# Patient Record
Sex: Female | Born: 2000 | Race: Black or African American | Hispanic: No | Marital: Single | Smoking: Never smoker
Health system: Southern US, Community
[De-identification: ages and names within clinical notes are randomized; demographics above are authoritative.]

---
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
## 2018-04-22 ENCOUNTER — Encounter (HOSPITAL_COMMUNITY): Payer: Self-pay | Admitting: Emergency Medicine

## 2018-04-22 ENCOUNTER — Emergency Department (HOSPITAL_COMMUNITY)
Admission: EM | Admit: 2018-04-22 | Discharge: 2018-04-22 | Disposition: A | Discharge status: Home / Self Care | Payer: Self-pay | Attending: Emergency Medicine | Admitting: Emergency Medicine

## 2018-04-22 DIAGNOSIS — J029 Acute pharyngitis, unspecified: Secondary | ICD-10-CM | POA: Insufficient documentation

## 2018-04-22 LAB — CBC WITH DIFFERENTIAL/PLATELET
ABS IMMATURE GRANULOCYTES: 0.03 10*3/uL (ref 0.00–0.07)
BASOS PCT: 0 %
Basophils Absolute: 0 10*3/uL (ref 0.0–0.1)
EOS ABS: 0.1 10*3/uL (ref 0.0–0.5)
Eosinophils Relative: 1 %
HEMATOCRIT: 38.1 % (ref 36.0–46.0)
Hemoglobin: 11.9 g/dL — ABNORMAL LOW (ref 12.0–15.0)
IMMATURE GRANULOCYTES: 0 %
LYMPHS ABS: 2.5 10*3/uL (ref 0.7–4.0)
Lymphocytes Relative: 28 %
MCH: 26.9 pg (ref 26.0–34.0)
MCHC: 31.2 g/dL (ref 30.0–36.0)
MCV: 86 fL (ref 80.0–100.0)
MONO ABS: 0.7 10*3/uL (ref 0.1–1.0)
Monocytes Relative: 8 %
NEUTROS PCT: 63 %
Neutro Abs: 5.7 10*3/uL (ref 1.7–7.7)
Platelets: 300 10*3/uL (ref 150–400)
RBC: 4.43 MIL/uL (ref 3.87–5.11)
RDW: 11.9 % (ref 11.5–15.5)
WBC: 9.1 10*3/uL (ref 4.0–10.5)
nRBC: 0 % (ref 0.0–0.2)

## 2018-04-22 LAB — GROUP A STREP BY PCR: GROUP A STREP BY PCR: NOT DETECTED

## 2018-04-22 LAB — MONONUCLEOSIS SCREEN: MONO SCREEN: NEGATIVE

## 2018-04-22 MED ORDER — LIDOCAINE VISCOUS HCL 2 % MT SOLN
15.0000 mL | Freq: Once | OROMUCOSAL | Status: AC
  Administered 2018-04-22: 15 mL via OROMUCOSAL
  Filled 2018-04-22: qty 15

## 2018-04-22 MED ORDER — LIDOCAINE VISCOUS HCL 2 % MT SOLN: 15.0000 mL | OROMUCOSAL | 0 refills | Status: AC | PRN

## 2018-04-22 MED ORDER — CETIRIZINE-PSEUDOEPHEDRINE ER 5-120 MG PO TB12: 1.0000 | ORAL_TABLET | Freq: Every day | ORAL | 0 refills | Status: AC

## 2018-04-22 NOTE — ED Triage Notes (Signed)
Pt c/o sore throat x 4 weeks.  Reports seen PCP 2 times and told has tonsillitis and nothing more can be done.   Pt reports last night when coughed felt like something came up but when swallowed feels like something stuck in her throat.

## 2018-04-22 NOTE — ED Provider Notes (Signed)
[facility]EM[facility]ENCY [facility]PT Provider Note   CSN: [id number] Arrival date & time: 04/22/18  1259   History   Chief Complaint Chief Complaint  Patient presents with  . Sore Throat    HPI Sarah Singleton is a 18 y.o. female presenting with sore throat onset 4 weeks ago. Patient reports she was evaluated by PCP twice and diagnosed with tonsillitis. Patient reports she had a fever last week, but fever resolved. Patient reports associated intermittent dry cough, congestion, and rhinorrhea. Patient reports fatigue and dysphagia. Patient states she has tried theraflu, tylenol, and ibuprofen with some relief. Patient reports yesterday she coughed mucus, but she still has the sensation that mucus is present. Patient denies shortness of breath, chest pain, abdominal pain, nausea, vomiting, or diarrhea. Patient reports sick contacts with influenza.    HPI  History reviewed. No pertinent past medical history.  There are no active problems to display for this patient.   History reviewed. No pertinent surgical history.   OB History   No obstetric history on file.      Home Medications    Prior to Admission medications   Medication Sig Start Date End Date Taking? Authorizing Provider  cetirizine-pseudoephedrine (ZYRTEC-D) 5-120 MG tablet Take 1 tablet by mouth daily. 04/22/18   Carlyle BasquesHernandez, Saber Dickerman P, PA-C  lidocaine (XYLOCAINE) 2 % solution Use as directed 15 mLs in the mouth or throat as needed for mouth pain. Gargle/swish and spit out the medication. 04/22/18   Leretha DykesHernandez, Kaidence Callaway P, PA-C    Family History No family history on file.  Social History Social History   Tobacco Use  . Smoking status: Never Smoker  . Smokeless tobacco: Never Used  Substance Use Topics  . Alcohol use: No    Frequency: Never  . Drug use: No     Allergies   Patient has no known allergies.   Review of Systems Review of Systems  Constitutional: Positive for appetite change, fatigue and fever.  Negative for activity change.  HENT: Positive for congestion, rhinorrhea, sore throat and trouble swallowing. Negative for ear pain, postnasal drip and voice change.   Eyes: Negative for pain, redness and itching.  Respiratory: Positive for cough. Negative for shortness of breath.   Cardiovascular: Negative for chest pain.  Gastrointestinal: Negative for abdominal pain, diarrhea, nausea and vomiting.  Musculoskeletal: Negative for myalgias.  Skin: Negative for rash.  Allergic/Immunologic: Negative for environmental allergies and immunocompromised state.  Neurological: Negative for dizziness, weakness and headaches.     Physical Exam Updated Vital Signs BP 105/75   Pulse 64   Temp 98.5 F (36.9 C) (Oral)   Resp 16   LMP 04/17/2018   SpO2 100%   Physical Exam Vitals signs and nursing note reviewed.  Constitutional:      General: She is not in acute distress.    Appearance: She is well-developed. She is not diaphoretic.  HENT:     Head: Normocephalic and atraumatic.     Right Ear: Tympanic membrane, ear canal and external ear normal. No tenderness. No middle ear effusion.     Left Ear: Tympanic membrane, ear canal and external ear normal. No tenderness.  No middle ear effusion.     Nose: Mucosal edema, congestion and rhinorrhea present.     Mouth/Throat:     Mouth: Mucous membranes are moist.     Pharynx: Uvula midline. Posterior oropharyngeal erythema present. No oropharyngeal exudate.     Tonsils: No tonsillar exudate or tonsillar abscesses. Swelling: 1+ on the  right. 1+ on the left.  Eyes:     General:        Right eye: No discharge.        Left eye: No discharge.     Conjunctiva/sclera: Conjunctivae normal.  Neck:     Musculoskeletal: Normal range of motion and neck supple.  Cardiovascular:     Rate and Rhythm: Normal rate and regular rhythm.     Heart sounds: Normal heart sounds. No murmur. No friction rub. No gallop.   Pulmonary:     Effort: Pulmonary effort is  normal. No respiratory distress.     Breath sounds: Normal breath sounds. No wheezing or rales.  Abdominal:     Palpations: Abdomen is soft.     Tenderness: There is no abdominal tenderness.  Musculoskeletal: Normal range of motion.  Lymphadenopathy:     Cervical: No cervical adenopathy.  Skin:    Findings: No rash.  Neurological:     Mental Status: She is alert and oriented to person, place, and time.     ED Treatments / Results  Labs (all labs ordered are listed, but only abnormal results are displayed) Labs Reviewed  CBC WITH DIFFERENTIAL/PLATELET - Abnormal; Notable for the following components:      Result Value   Hemoglobin 11.9 (*)    All other components within normal limits  GROUP A STREP BY PCR  MONONUCLEOSIS SCREEN    EKG None  Radiology No results found.  Procedures Procedures (including critical care time)  Medications Ordered in ED Medications  lidocaine (XYLOCAINE) 2 % viscous mouth solution 15 mL (15 mLs Mouth/Throat Given 04/22/18 1718)     Initial Impression / Assessment and Plan / ED Course  I have reviewed the triage vital signs and the nursing notes.  Pertinent labs & imaging results that were available during my care of the patient were reviewed by me and considered in my medical decision making (see chart for details).    Patients symptoms are consistent with URI, likely viral etiology. Strep test is negative. WBCs are within normal limits. Monospot test is negative. Discussed that antibiotics are not indicated at this time. Suspect symptoms have have a viral vs allergic etiology. Will prescribe zyrtec and lidocaine for symptoms. Pt will be discharged with symptomatic treatment.  Discussed importance of following up with PCP. Verbalizes understanding and is agreeable with plan. Pt is hemodynamically stable & in NAD prior to dc.   Final Clinical Impressions(s) / ED Diagnoses   Final diagnoses:  Sore throat    ED Discharge Orders          Ordered    cetirizine-pseudoephedrine (ZYRTEC-D) 5-120 MG tablet  Daily     04/22/18 1716    lidocaine (XYLOCAINE) 2 % solution  As needed     04/22/18 1716           Leretha Dykes, [place] 04/22/18 1744    Mancel Bale, MD 04/23/18 0004

## 2018-04-22 NOTE — Discharge Instructions (Addendum)
You have been seen today for sore throat. Please read and follow all provided instructions.   1. Medications: zyrtec, viscous lidocaine for sore throat, usual home medications 2. Treatment: rest, drink plenty of fluids 3. Follow Up: Please follow up with your primary doctor in 2 days for discussion of your diagnoses and further evaluation after today's visit; if you do not have a primary care doctor use the resource guide provided to find one; Please return to the ER for any new or worsening symptoms. Please obtain all of your results from medical records or have your doctors office obtain the results - share them with your doctor - you should be seen at your doctors office. Call today to arrange your follow up.   Take medications as prescribed. Please review all of the medicines and only take them if you do not have an allergy to them. Return to the emergency room for worsening condition or new concerning symptoms. Follow up with your regular doctor. If you don't have a regular doctor use one of the numbers below to establish a primary care doctor.  Please be aware that if you are taking birth control pills, taking other prescriptions, ESPECIALLY ANTIBIOTICS may make the birth control ineffective - if this is the case, either do not engage in sexual activity or use alternative methods of birth control such as condoms until you have finished the medicine and your family doctor says it is OK to restart them. If you are on a blood thinner such as COUMADIN, be aware that any other medicine that you take may cause the coumadin to either work too much, or not enough - you should have your coumadin level rechecked in next 7 days if this is the case.  ?  It is also a possibility that you have an allergic reaction to any of the medicines that you have been prescribed - Everybody reacts differently to medications and while MOST people have no trouble with most medicines, you may have a reaction such as nausea,  vomiting, rash, swelling, shortness of breath. If this is the case, please stop taking the medicine immediately and contact your physician.  ?  You should return to the ER if you develop severe or worsening symptoms.   Emergency Department Resource Guide 1) Find a Doctor and Pay Out of Pocket Although you won't have to find out who is covered by your insurance plan, it is a good idea to ask around and get recommendations. You will then need to call the office and see if the doctor you have chosen will accept you as a new patient and what types of options they offer for patients who are self-pay. Some doctors offer discounts or will set up payment plans for their patients who do not have insurance, but you will need to ask so you aren't surprised when you get to your appointment.  2) Contact Your Local Health Department Not all health departments have doctors that can see patients for sick visits, but many do, so it is worth a call to see if yours does. If you don't know where your local health department is, you can check in your phone book. The CDC also has a tool to help you locate your state's health department, and many state websites also have listings of all of their local health departments.  3) Find a Walk-in Clinic If your illness is not likely to be very severe or complicated, you may want to try a walk in clinic.  These are popping up all over the country in pharmacies, drugstores, and shopping centers. They're usually staffed by nurse practitioners or physician assistants that have been trained to treat common illnesses and complaints. They're usually fairly quick and inexpensive. However, if you have serious medical issues or chronic medical problems, these are probably not your best option.  No Primary Care Doctor: Call Health Connect at  [phone] - they can help you locate a primary care doctor that  accepts your insurance, provides certain services, etc. Physician Referral [phone]  Emergency Department Resource Guide 1) Find a Doctor and Pay Out of Pocket Although you won't have to find out who is covered by your insurance plan, it is a good idea to ask around and get recommendations. You will then need to call the office and see if the doctor you have chosen will accept you as a new patient and what types of options they offer for patients who are self-pay. Some doctors offer discounts or will set up payment plans for their patients who do not have insurance, but you will need to ask so you aren't surprised when you get to your appointment.  2) Contact Your Local Health Department Not all health departments have doctors that can see patients for sick visits, but many do, so it is worth a call to see if yours does. If you don't know where your local health department is, you can check in your phone book. The CDC also has a tool to help you locate your state's health department, and many state websites also have listings of all of their local health departments.  3) Find a [facility] If your illness is not likely to be very severe or complicated, you may want to try a walk in clinic. These are popping up all over the country in pharmacies, drugstores, and shopping centers. They're usually staffed by nurse practitioners or physician assistants that have been trained to treat common illnesses and complaints. They're usually fairly quick and inexpensive. However, if you have serious medical issues or chronic medical problems, these are probably not your best option.  No Primary Care Doctor: Call Health Connect at  [phone] - they can help you locate a primary care doctor that  accepts your insurance, provides certain services, etc. Physician Referral Service- [phone]  Chronic Pain Problems: Organization         Address  Phone   Notes  [facility]  [phone] Patients need to be referred by their primary care doctor.    Medication Assistance: Organization         Address  Phone   Notes  [facility] Medication [facility] [address] [place], [place] [address] [phone] --Must be a resident of [facility] -- Must have NO insurance coverage whatsoever (no Medicaid/ Medicare, etc.) -- The pt. MUST have a primary care doctor that directs their care regularly and follows them in the community   [facility]  [phone]   [employer]  [phone]    Agencies that provide inexpensive medical care: Organization         Address  Phone   Notes  [place]  [phone]   [facility]    [phone]   [facility] [place], [place] [address] [phone]   [place] [address], [place] [phone]   [facility]    (  [phone]   [facility]    [phone]   [facility]  [address], [place] Phone:  [phone], Fax:  [phone] Hours of Operation:  9 am - 6 pm, M-F.  Also accepts Medicaid/Medicare and self-pay.  [facility] for [place] [address], [address], [place] Phone: [phone], Fax: [phone]. Hours of Operation:  8:30 am - 5:30 pm, M-F.  Also accepts Medicaid and self-pay.  [facility] [place] [address], [place] Phone: [phone]   [place], [place], [place] [phone], Ext. [phone] Mondays & Thursdays: 7-9 AM.  First 15 patients are seen on a first come, first serve basis.    [place] Providers:  Organization         Address  Phone   Notes  [facility] [address], [place] [phone] Also accepts self-pay patients.  [facility] [address], [place]  [phone]   [place], [address], [place] [phone]   [facility] [address] [place] [phone]   Lucianne Lei [address], [place]   [phone] Only accepts [place] [facility] [place] patients after they have their name applied to their card.   Self-Pay (no insurance) in [facility]:  Organization         Address  Phone   Notes  Sickle Cell Patients, [facility] [place] [phone]   [facility] [place] [phone]   [facility] [place]  [place], [place], [place] [phone]   [facility]/Dr. Osei-Bonsu  [address], [place] or [address], [place] [phone] Phone number for both [place] and [place] locations is the same.  [facility] [address], [place] [phone]   [facility] [address] [place] or [address] [phone]   [facility] [address] [place] [phone], phone; [phone], fax Sees patients 1st and 3rd Saturday of every month.  Must not qualify for public or private insurance (i.e. Medicaid, Medicare, [place] [employer] Choice, Veterans' Benefits)  Household income should be no more than 200% of the poverty level The clinic cannot treat you if you are pregnant or think you are pregnant  Sexually transmitted diseases are not treated at the clinic.

## 2018-05-14 ENCOUNTER — Encounter (HOSPITAL_COMMUNITY): Payer: Self-pay

## 2018-05-14 ENCOUNTER — Emergency Department (HOSPITAL_COMMUNITY)
Admission: EM | Admit: 2018-05-14 | Discharge: 2018-05-14 | Disposition: A | Discharge status: Home / Self Care | Payer: Self-pay | Attending: Emergency Medicine | Admitting: Emergency Medicine

## 2018-05-14 DIAGNOSIS — R05 Cough: Secondary | ICD-10-CM | POA: Insufficient documentation

## 2018-05-14 DIAGNOSIS — Z5321 Procedure and treatment not carried out due to patient leaving prior to being seen by health care provider: Secondary | ICD-10-CM | POA: Insufficient documentation

## 2018-05-14 NOTE — ED Triage Notes (Signed)
Pt c/o persistent cough x 1 week with congestion.

## 2018-05-14 NOTE — ED Notes (Signed)
Pt stated she needed to go home, she would come back in the morning

## 2018-05-15 ENCOUNTER — Emergency Department (HOSPITAL_COMMUNITY): Payer: Self-pay

## 2018-05-15 ENCOUNTER — Encounter (HOSPITAL_COMMUNITY): Payer: Self-pay | Admitting: Emergency Medicine

## 2018-05-15 ENCOUNTER — Emergency Department (HOSPITAL_COMMUNITY)
Admission: EM | Admit: 2018-05-15 | Discharge: 2018-05-15 | Disposition: A | Discharge status: Home / Self Care | Payer: Self-pay | Attending: Emergency Medicine | Admitting: Emergency Medicine

## 2018-05-15 DIAGNOSIS — R0981 Nasal congestion: Secondary | ICD-10-CM | POA: Insufficient documentation

## 2018-05-15 DIAGNOSIS — J029 Acute pharyngitis, unspecified: Secondary | ICD-10-CM | POA: Insufficient documentation

## 2018-05-15 DIAGNOSIS — Z79899 Other long term (current) drug therapy: Secondary | ICD-10-CM | POA: Insufficient documentation

## 2018-05-15 DIAGNOSIS — R05 Cough: Secondary | ICD-10-CM | POA: Insufficient documentation

## 2018-05-15 DIAGNOSIS — R0989 Other specified symptoms and signs involving the circulatory and respiratory systems: Secondary | ICD-10-CM | POA: Insufficient documentation

## 2018-05-15 DIAGNOSIS — J3489 Other specified disorders of nose and nasal sinuses: Secondary | ICD-10-CM | POA: Insufficient documentation

## 2018-05-15 MED ORDER — DEXAMETHASONE SODIUM PHOSPHATE 4 MG/ML IJ SOLN
4.0000 mg | Freq: Once | INTRAMUSCULAR | Status: AC
  Administered 2018-05-15: 4 mg via INTRAMUSCULAR
  Filled 2018-05-15: qty 1

## 2018-05-15 NOTE — ED Triage Notes (Signed)
1 week of sore throat and cough. Been taking dayquil. She reports she has been having headaches and pain above her eye which brought her in today.

## 2018-05-15 NOTE — Discharge Instructions (Addendum)
Please purchase an expectorant like Mucinex to help loosen and thin the mucus production.You may also try some over the counter Robitussin to help with your cough. Continue to hydrate with plenty of fluids and Gatorade.If you experience any shortness of breath, chest pain or fever please return to the ED for reevaluation.  ° °

## 2018-05-15 NOTE — ED Provider Notes (Signed)
[facility] EMERGENCY DEPARTMENT Provider Note   CSN: [id number] Arrival date & time: 05/15/18  5366     History   Chief Complaint Chief Complaint  Patient presents with  . Sore Throat    HPI Sarah Singleton is a 18 y.o. female.  18 y.o female with no PMH presents to the ED with a chief complaint of sore throat x 1 week. Patient describes a burning sensation with swallowing food and her secretions, she also reports a productive cough.  She states feeling some sinus pressure along with congestion on her chest.  She denies any exacerbating or alleviating factors.  Patient has tried DayQuil daily for the past week but reports no improvement in symptoms, reports not trying any decongestions.  She denies any shortness of breath, chest pain, headache, previous history of asthma or COPD.      OB History   No obstetric history on file.      Home Medications    Prior to Admission medications   Medication Sig Start Date End Date Taking? Authorizing Provider  cetirizine-pseudoephedrine (ZYRTEC-D) 5-120 MG tablet Take 1 tablet by mouth daily. 04/22/18   Carlyle Basques P, PA-C  lidocaine (XYLOCAINE) 2 % solution Use as directed 15 mLs in the mouth or throat as needed for mouth pain. Gargle/swish and spit out the medication. 04/22/18   Leretha Dykes, PA-C    Family History History reviewed. No pertinent family history.  Social History Social History   Tobacco Use  . Smoking status: Never Smoker  . Smokeless tobacco: Never Used  Substance Use Topics  . Alcohol use: No    Frequency: Never  . Drug use: No     Allergies   Patient has no known allergies.   Review of Systems Review of Systems  Constitutional: Negative for fever.  HENT: Positive for sore throat.   Respiratory: Positive for cough. Negative for shortness of breath.   Cardiovascular: Negative for chest pain.     Physical Exam Updated Vital Signs BP 106/71 (BP Location: Right Arm)   Pulse  84   Temp 98.5 F (36.9 C) (Oral)   Resp 17   LMP 05/14/2018   SpO2 100%   Physical Exam Vitals signs and nursing note reviewed.  Constitutional:      General: She is not in acute distress.    Appearance: She is well-developed.  HENT:     Head: Normocephalic and atraumatic.     Nose: Congestion and rhinorrhea present.     Mouth/Throat:     Mouth: Mucous membranes are moist.     Pharynx: Oropharynx is clear. Uvula midline. Posterior oropharyngeal erythema present. No pharyngeal swelling or oropharyngeal exudate.     Tonsils: No tonsillar exudate. Swelling: 2+ on the right. 2+ on the left.  Eyes:     Pupils: Pupils are equal, round, and reactive to light.  Neck:     Musculoskeletal: Normal range of motion.  Cardiovascular:     Rate and Rhythm: Normal rate and regular rhythm.     Heart sounds: Normal heart sounds.  Pulmonary:     Effort: Pulmonary effort is normal. No respiratory distress.     Breath sounds: Normal breath sounds.  Abdominal:     General: Bowel sounds are normal. There is no distension.     Palpations: Abdomen is soft.     Tenderness: There is no abdominal tenderness.  Musculoskeletal:        General: No tenderness or deformity.  Right lower leg: No edema.     Left lower leg: No edema.  Skin:    General: Skin is warm and dry.  Neurological:     Mental Status: She is alert and oriented to person, place, and time.      ED Treatments / Results  Labs (all labs ordered are listed, but only abnormal results are displayed) Labs Reviewed - No data to display  EKG None  Radiology Dg Chest 2 View  Result Date: 05/15/2018 CLINICAL DATA:  Pain under both breasts EXAM: CHEST - 2 VIEW COMPARISON:  None. FINDINGS: The heart size and mediastinal contours are within normal limits. Both lungs are clear. The visualized skeletal structures are unremarkable. IMPRESSION: No active cardiopulmonary disease. Electronically Signed   By: Elige Ko   On: 05/15/2018  10:10    Procedures Procedures (including critical care time)  Medications Ordered in ED Medications  dexamethasone (DECADRON) injection 4 mg (4 mg Intramuscular Given 05/15/18 1004)     Initial Impression / Assessment and Plan / ED Course  I have reviewed the triage vital signs and the nursing notes.  Pertinent labs & imaging results that were available during my care of the patient were reviewed by me and considered in my medical decision making (see chart for details).    Patent presents with 1 week of cough and sore throat, no previous PMH.  During examination patient is got clear lung sounds to auscultation, mild erythema on the oropharynx but no swelling or tonsillar abscesses.  Due to patient's symptoms been active for 1 week will obtain chest x-ray to rule out any pneumonia she reports somewhat of a productive cough this past week.  Patient is well-appearing, nontoxic will treat with over-the-counter therapy along with have her follow-up with PCP as needed.She has been provided with decadron 4 mg for relieve in her symptoms. DG Chest 2 view showed no consolidation, no acute process.   She will be tried with the medic treatment, suspicious for viral etiology.  She is advised to follow-up with PCP.  Patient understands and agrees with management.  Vitals stable for discharge afebrile.  Final Clinical Impressions(s) / ED Diagnoses   Final diagnoses:  Sore throat    ED Discharge Orders    None       Claude Manges, PA-C 05/15/18 1019    Jacalyn Lefevre, MD 05/15/18 1115

## 2018-05-15 NOTE — ED Notes (Signed)
Pt verbalized understanding of discharge instructions and denies any further questions at this time.   

## 2018-08-20 DIAGNOSIS — J312 Chronic pharyngitis: Secondary | ICD-10-CM

## 2018-08-20 NOTE — ED Triage Notes (Signed)
Patient arrives ambulatory to ED with c/c of head pain and neck pain, onset a few weeks. Patient is requesting an MRI saying everyone is taking this lightly. Patient was seen at [facility] these last 2 days and had blood drawn and was tested for strep throat which was all negative.

## 2018-08-20 NOTE — ED Provider Notes (Signed)
EMERGENCY DEPARTMENT HISTORY AND PHYSICAL EXAM    Date: 08/20/2018  Patient Name: Roberta Thomas    History of Presenting Illness     Chief Complaint   Patient presents with   ??? Headache   ??? Neck Pain         History Provided By: Patient    Additional History (Context):   10:48 PM  Roberta Thomas is a 18 y.o. female with no PMHX who presents to the emergency department C/O HA x 1 week.  Pt has associated sxs of neck pain,  nausea, loss of appetite, a sore throat x 2 days and SOB. Pt notes that she feels as if her HA "popped" and the pain traveled down the back of her head to her neck. Pt denies fever, chills, chest pain and any recent sick contact. Pt notes she was tested for Strep Throat yesterday at [facility] and discharged home. She notes that she wanted some of sort of scan to be performed to make sure her neck muscles were okay but was told no at [facility] and instead referred to an ENT.         Social History  No tobacco use. No ETOH use. No illicit drug use.     Family History  No pertinent history     PCP: None        Past History     Past Medical History:  History reviewed. No pertinent past medical history.    Past Surgical History:  History reviewed. No pertinent surgical history.    Family History:  History reviewed. No pertinent family history.    Social History:  Social History     Tobacco Use   ??? Smoking status: Never Smoker   ??? Smokeless tobacco: Never Used   Substance Use Topics   ??? Alcohol use: Not Currently   ??? Drug use: Not Currently       Allergies:  No Known Allergies      Review of Systems   Review of Systems   Constitutional: Positive for appetite change. Negative for chills and fever.   HENT: Positive for sore throat.    Respiratory: Positive for shortness of breath.    Cardiovascular: Negative for chest pain.   Gastrointestinal: Positive for nausea.   Musculoskeletal: Positive for neck pain.   Neurological: Positive for headaches.   All other systems reviewed and are negative.         Physical Exam     Vitals:    08/20/18 2120 08/21/18 0055   BP: 118/69 122/77   Pulse: 75    Resp: 18 16   Temp: 96 ??F (35.6 ??C)    SpO2: 100% 100%   Weight: 64.4 kg (142 lb)    Height:  (1.6 m)      Physical Exam  Vitals signs and nursing note reviewed.   Constitutional:       General: She is not in acute distress.     Appearance: She is well-developed. She is not diaphoretic.   HENT:      Head: Normocephalic and atraumatic.      Right Ear: External ear normal.      Left Ear: External ear normal.      Ears:      Comments: Mostly clear, TM obscured by her cerumen     Mouth/Throat:      Pharynx: No oropharyngeal exudate.      Comments: Tonsillar hypertrophy without exudate and no malodor  Sinus has prominent erythema  Eyes:      General: No scleral icterus.     Conjunctiva/sclera: Conjunctivae normal.      Pupils: Pupils are equal, round, and reactive to light.   Neck:      Musculoskeletal: Normal range of motion and neck supple.      Thyroid: No thyromegaly.      Vascular: No JVD.      Trachea: No tracheal deviation.      Comments: Diffusely tender with bilateral diffuse cervical adenopathy and no crepitus  Cardiovascular:      Rate and Rhythm: Normal rate and regular rhythm.      Heart sounds: Normal heart sounds.   Pulmonary:      Effort: Pulmonary effort is normal. No respiratory distress.      Breath sounds: Normal breath sounds. No stridor.   Abdominal:      General: Bowel sounds are normal. There is no distension.      Palpations: Abdomen is soft.      Tenderness: There is no abdominal tenderness. There is no guarding or rebound.   Musculoskeletal: Normal range of motion.         General: No tenderness.   Lymphadenopathy:      Cervical: No cervical adenopathy.   Skin:     General: Skin is warm and dry.      Findings: No erythema or rash.   Neurological:      Mental Status: She is alert and oriented to person, place, and time.      Cranial Nerves: No cranial nerve deficit.       Coordination: Coordination normal.      Deep Tendon Reflexes: Reflexes are normal and symmetric. Reflexes normal.   Psychiatric:         Behavior: Behavior normal.         Thought Content: Thought content normal.         Judgment: Judgment normal.             Diagnostic Study Results     Labs -   No results found for this or any previous visit (from the past 12 hour(s)).    Radiologic Studies -   CT NECK SOFT TISSUE WO CONT   Final Result   IMPRESSION: No significant abnormality.      CT HEAD WO CONT   Final Result   IMPRESSION:      No acute intracranial abnormalities.        CT Results  (Last 48 hours)               08/20/18 2358  CT NECK SOFT TISSUE WO CONT Final result    Impression:  IMPRESSION: No significant abnormality.       Narrative:  EXAM: CT NECK SOFT TISSUE WO CONT       CLINICAL INDICATION/HISTORY: neck pain, ?? FB       COMPARISON: None.       TECHNIQUE: Contiguous axial CT images were obtained from the level of the   frontal sinuses through the thoracic inlet without intravenous contrast.   Multiplanar reformats are submitted for evaluation. One or more dose reduction   techniques were used on this CT: automated exposure control, adjustment of the   mAs and/or kVp according to patient size, and iterative reconstruction   techniques.  The specific techniques used on this CT exam have been documented   in the patient's electronic medical record.  Digital Imaging and Communications   in Medicine (DICOM) format  image data are available to nonaffiliated external   healthcare facilities or entities on a secure, media free, reciprocally   searchable basis with patient authorization for at least a 71-month period after   this study.           _______________       FINDINGS:        INTRACRANIAL CONTENTS: Limited visualization is grossly unremarkable.       ORBITS: The optic globes are intact. The lenses are in anatomic position. The   optic nerves are intact. There is no retro-orbital abnormality.        PARANASAL SINUSES: Clear.       SOFT TISSUES: The masticator and pterygoid spaces are unremarkable. The   parapharyngeal fat planes are preserved. No prevertebral or retropharyngeal   abnormality.       GLANDS: The parotid and submandibular glands are symmetric and normal in   appearance. The thyroid is unremarkable.       VASCULATURE: Unremarkable.       LYMPH NODES: No enlarged lymph nodes.       AIRWAY: Patent.       OSSEOUS: No acute or aggressive osseous abnormality. Neuroforamina and spinal   canal are patent.       LUNG APICES: Clear.       OTHER: Soft tissue density in the anterior mediastinum compatible with residual   thymic tissue partially visualized.       _______________           08/20/18 2358  CT HEAD WO CONT Final result    Impression:  IMPRESSION:       No acute intracranial abnormalities.       Narrative:  EXAM: CT HEAD WO CONT       CLINICAL INDICATION/HISTORY: Severe headache       COMPARISON: None.       TECHNIQUE: Axial CT imaging of the head was performed without intravenous   contrast. Sagittal and coronal reconstructions are provided. One or more dose   reduction techniques were used on this CT: automated exposure control,   adjustment of the mAs and/or kVp according to patient size, and iterative   reconstruction techniques.  The specific techniques used on this CT exam have   been documented in the patient's electronic medical record. Digital Imaging and   Communications in Medicine (DICOM) format image data are available to   nonaffiliated external healthcare facilities or entities on a secure, media   free, reciprocally searchable basis with patient authorization for at least a   21-month period after this study           _______________       FINDINGS:       BRAIN AND POSTERIOR FOSSA: The sulci, folia, ventricles and basal cisterns are   within normal limits for the patient's age. There is no intracranial hemorrhage,    mass effect, or midline shift. There are no areas of abnormal parenchymal   attenuation.       EXTRA-AXIAL SPACES AND MENINGES: There are no abnormal extra-axial fluid   collections.       CALVARIUM: Intact.       SINUSES: Clear.       OTHER: None.       _______________               CXR Results  (Last 48 hours)    None          Medications given in the ED-  Medications - No data to display      Medical Decision Making   I am the first provider for this patient.    I reviewed the vital signs, available nursing notes, past medical history, past surgical history, family history and social history.    Vital Signs-Reviewed the patient's vital signs.    Pulse Oximetry Analysis - 100% on RA         Records Reviewed: NURSING NOTES AND PREVIOUS MEDICAL RECORDS    Provider Notes (Medical Decision Making):   Sxs in exam are consistent with sinus HA but has extensive visits. There are no prior images taken and she certainly could have a foreign body by complaint or chronic reflux. Possible but unlikely underlying tumor. Previous workups of strep throat  have been negative as well as mono testing. It is not likely to be mumps.     Procedures:  Procedures    ED Course:   10:48 PM: Initial assessment performed. The patients presenting problems have been discussed, and they are in agreement with the care plan formulated and outlined with them.  I have encouraged them to ask questions as they arise throughout their visit.    Diagnosis and Disposition       DISCHARGE NOTE:  1:06 AM    Roberta Thomas's  results have been reviewed with her.  She has been counseled regarding her diagnosis, treatment, and plan.  She verbally conveys understanding and agreement of the signs, symptoms, diagnosis, treatment and prognosis and additionally agrees to follow up as discussed.  She also agrees with the care-plan and conveys that all of her questions have been answered.  I have also provided discharge instructions for her  that include: educational information regarding their diagnosis and treatment, and list of reasons why they would want to return to the ED prior to their follow-up appointment, should her condition change. She has been provided with education for proper emergency department utilization.     CLINICAL IMPRESSION:    1. Chronic sore throat        PLAN:  1. D/C Home  2.   Discharge Medication List as of 08/21/2018  1:08 AM      START taking these medications    Details   Omeprazole delayed release (PRILOSEC D/R) 20 mg tablet Take 1 Tab by mouth daily., Print, Disp-30 Tab, R-1      zinc lozenges 25 mg lozg Use as needed for sore throat, Print, Disp-60 Lozenge, R-0           3.   Follow-up Information     Follow up With Specialties Details Why Contact Info    Morene RankinsWatts, Cara Alexandria, MD Otolaryngology Schedule an appointment as soon as possible for a visit in 2 days Follow up with ENT [address]  [address]  [place] [id number]  [phone]      [facility] Emergency Medicine Go to As needed, If symptoms worsen [address]  [place] [id number]  [phone]        _______________________________    This note was partially transcribed via voice recognition software. Although efforts have been made to catch any discrepancies, it may contain sound alike words, grammatical errors, or nonsensical words.          Attestations:  This note is prepared by Darden RestaurantsChinwendu Madukwe , acting as Neurosurgeoncribe for [employer] HoldingsJeffrey D. Edilia Boickson, MD .    Duane LopeJeffrey D. Edilia Boickson, MD :  The scribe's documentation has been  prepared under my direction and personally reviewed by me in its entirety.  I confirm that the note above accurately reflects all work, treatment, procedures, and medical decision making performed by me.

## 2018-08-20 NOTE — ED Provider Notes (Signed)
EMERGENCY DEPARTMENT HISTORY AND PHYSICAL EXAM    Date: 08/20/2018  Patient Name: Roberta Thomas    History of Presenting Illness     Chief Complaint   Patient presents with   ??? Headache   ??? Neck Pain         History Provided By: Patient    Additional History (Context):   10:48 PM  Roberta Thomas is a 18 y.o. female with no PMHX who presents to the emergency department C/O HA x 1 week.  Pt has associated sxs of neck pain,  nausea, loss of appetite, a sore throat x 2 days and SOB. Pt notes that she feels as if her HA "popped" and the pain traveled down the back of her head to her neck. Pt denies fever, chills, chest pain and any recent sick contact. Pt notes she was tested for Strep Throat yesterday at [facility] and discharged home. She notes that she wanted some of sort of scan to be performed to make sure her neck muscles were okay but was told no at [facility] and instead referred to an ENT.         Social History  No tobacco use. No ETOH use. No illicit drug use.     Family History  No pertinent history     PCP: None        Past History     Past Medical History:  History reviewed. No pertinent past medical history.    Past Surgical History:  History reviewed. No pertinent surgical history.    Family History:  History reviewed. No pertinent family history.    Social History:  Social History     Tobacco Use   ??? Smoking status: Never Smoker   ??? Smokeless tobacco: Never Used   Substance Use Topics   ??? Alcohol use: Not Currently   ??? Drug use: Not Currently       Allergies:  No Known Allergies      Review of Systems   Review of Systems   Constitutional: Positive for appetite change. Negative for chills and fever.   HENT: Positive for sore throat.    Respiratory: Positive for shortness of breath.    Cardiovascular: Negative for chest pain.   Gastrointestinal: Positive for nausea.   Musculoskeletal: Positive for neck pain.   Neurological: Positive for headaches.   All other systems reviewed and are negative.        Physical  Exam     Vitals:    08/20/18 2120 08/21/18 0055   BP: 118/69 122/77   Pulse: 75    Resp: 18 16   Temp: 96 ??F (35.6 ??C)    SpO2: 100% 100%   Weight: 64.4 kg (142 lb)    Height:  (1.6 m)      Physical Exam  Vitals signs and nursing note reviewed.   Constitutional:       General: She is not in acute distress.     Appearance: She is well-developed. She is not diaphoretic.   HENT:      Head: Normocephalic and atraumatic.      Right Ear: External ear normal.      Left Ear: External ear normal.      Ears:      Comments: Mostly clear, TM obscured by her cerumen     Mouth/Throat:      Pharynx: No oropharyngeal exudate.      Comments: Tonsillar hypertrophy without exudate and no malodor  Sinus has prominent erythema  Eyes:      General: No scleral icterus.     Conjunctiva/sclera: Conjunctivae normal.      Pupils: Pupils are equal, round, and reactive to light.   Neck:      Musculoskeletal: Normal range of motion and neck supple.      Thyroid: No thyromegaly.      Vascular: No JVD.      Trachea: No tracheal deviation.      Comments: Diffusely tender with bilateral diffuse cervical adenopathy and no crepitus  Cardiovascular:      Rate and Rhythm: Normal rate and regular rhythm.      Heart sounds: Normal heart sounds.   Pulmonary:      Effort: Pulmonary effort is normal. No respiratory distress.      Breath sounds: Normal breath sounds. No stridor.   Abdominal:      General: Bowel sounds are normal. There is no distension.      Palpations: Abdomen is soft.      Tenderness: There is no abdominal tenderness. There is no guarding or rebound.   Musculoskeletal: Normal range of motion.         General: No tenderness.   Lymphadenopathy:      Cervical: No cervical adenopathy.   Skin:     General: Skin is warm and dry.      Findings: No erythema or rash.   Neurological:      Mental Status: She is alert and oriented to person, place, and time.      Cranial Nerves: No cranial nerve deficit.      Coordination: Coordination normal.       Deep Tendon Reflexes: Reflexes are normal and symmetric. Reflexes normal.   Psychiatric:         Behavior: Behavior normal.         Thought Content: Thought content normal.         Judgment: Judgment normal.             Diagnostic Study Results     Labs -   No results found for this or any previous visit (from the past 12 hour(s)).    Radiologic Studies -   CT NECK SOFT TISSUE WO CONT   Final Result   IMPRESSION: No significant abnormality.      CT HEAD WO CONT   Final Result   IMPRESSION:      No acute intracranial abnormalities.        CT Results  (Last 48 hours)               08/20/18 2358  CT NECK SOFT TISSUE WO CONT Final result    Impression:  IMPRESSION: No significant abnormality.       Narrative:  EXAM: CT NECK SOFT TISSUE WO CONT       CLINICAL INDICATION/HISTORY: neck pain, ?? FB       COMPARISON: None.       TECHNIQUE: Contiguous axial CT images were obtained from the level of the   frontal sinuses through the thoracic inlet without intravenous contrast.   Multiplanar reformats are submitted for evaluation. One or more dose reduction   techniques were used on this CT: automated exposure control, adjustment of the   mAs and/or kVp according to patient size, and iterative reconstruction   techniques.  The specific techniques used on this CT exam have been documented   in the patient's electronic medical record.  Digital Imaging and Communications   in Medicine (DICOM) format  image data are available to nonaffiliated external   healthcare facilities or entities on a secure, media free, reciprocally   searchable basis with patient authorization for at least a 3487-month period after   this study.           _______________       FINDINGS:        INTRACRANIAL CONTENTS: Limited visualization is grossly unremarkable.       ORBITS: The optic globes are intact. The lenses are in anatomic position. The   optic nerves are intact. There is no retro-orbital abnormality.       PARANASAL SINUSES: Clear.       SOFT  TISSUES: The masticator and pterygoid spaces are unremarkable. The   parapharyngeal fat planes are preserved. No prevertebral or retropharyngeal   abnormality.       GLANDS: The parotid and submandibular glands are symmetric and normal in   appearance. The thyroid is unremarkable.       VASCULATURE: Unremarkable.       LYMPH NODES: No enlarged lymph nodes.       AIRWAY: Patent.       OSSEOUS: No acute or aggressive osseous abnormality. Neuroforamina and spinal   canal are patent.       LUNG APICES: Clear.       OTHER: Soft tissue density in the anterior mediastinum compatible with residual   thymic tissue partially visualized.       _______________           08/20/18 2358  CT HEAD WO CONT Final result    Impression:  IMPRESSION:       No acute intracranial abnormalities.       Narrative:  EXAM: CT HEAD WO CONT       CLINICAL INDICATION/HISTORY: Severe headache       COMPARISON: None.       TECHNIQUE: Axial CT imaging of the head was performed without intravenous   contrast. Sagittal and coronal reconstructions are provided. One or more dose   reduction techniques were used on this CT: automated exposure control,   adjustment of the mAs and/or kVp according to patient size, and iterative   reconstruction techniques.  The specific techniques used on this CT exam have   been documented in the patient's electronic medical record. Digital Imaging and   Communications in Medicine (DICOM) format image data are available to   nonaffiliated external healthcare facilities or entities on a secure, media   free, reciprocally searchable basis with patient authorization for at least a   5787-month period after this study           _______________       FINDINGS:       BRAIN AND POSTERIOR FOSSA: The sulci, folia, ventricles and basal cisterns are   within normal limits for the patient's age. There is no intracranial hemorrhage,   mass effect, or midline shift. There are no areas of abnormal parenchymal   attenuation.        EXTRA-AXIAL SPACES AND MENINGES: There are no abnormal extra-axial fluid   collections.       CALVARIUM: Intact.       SINUSES: Clear.       OTHER: None.       _______________               CXR Results  (Last 48 hours)    None          Medications given in the ED-  Medications - No data to display      Medical Decision Making   I am the first provider for this patient.    I reviewed the vital signs, available nursing notes, past medical history, past surgical history, family history and social history.    Vital Signs-Reviewed the patient's vital signs.    Pulse Oximetry Analysis - 100% on RA         Records Reviewed: NURSING NOTES AND PREVIOUS MEDICAL RECORDS    Provider Notes (Medical Decision Making):   Sxs in exam are consistent with sinus HA but has extensive visits. There are no prior images taken and she certainly could have a foreign body by complaint or chronic reflux. Possible but unlikely underlying tumor. Previous workups of strep throat  have been negative as well as mono testing. It is not likely to be mumps.     Procedures:  Procedures    ED Course:   10:48 PM: Initial assessment performed. The patients presenting problems have been discussed, and they are in agreement with the care plan formulated and outlined with them.  I have encouraged them to ask questions as they arise throughout their visit.    Diagnosis and Disposition       DISCHARGE NOTE:  1:06 AM    Roberta Thomas's  results have been reviewed with her.  She has been counseled regarding her diagnosis, treatment, and plan.  She verbally conveys understanding and agreement of the signs, symptoms, diagnosis, treatment and prognosis and additionally agrees to follow up as discussed.  She also agrees with the care-plan and conveys that all of her questions have been answered.  I have also provided discharge instructions for her that include: educational information regarding their diagnosis and treatment, and list of reasons why they would  want to return to the ED prior to their follow-up appointment, should her condition change. She has been provided with education for proper emergency department utilization.     CLINICAL IMPRESSION:    1. Chronic sore throat        PLAN:  1. D/C Home  2.   Discharge Medication List as of 08/21/2018  1:08 AM      START taking these medications    Details   Omeprazole delayed release (PRILOSEC D/R) 20 mg tablet Take 1 Tab by mouth daily., Print, Disp-30 Tab, R-1      zinc lozenges 25 mg lozg Use as needed for sore throat, Print, Disp-60 Lozenge, R-0           3.   Follow-up Information     Follow up With Specialties Details Why Contact Info    Morene Rankins, MD Otolaryngology Schedule an appointment as soon as possible for a visit in 2 days Follow up with ENT [address] [facility]  [address]  [place] [address]  [phone]      [facility] EMERGENCY DEPT Emergency Medicine Go to As needed, If symptoms worsen [address]  [place] [address]  [phone]        _______________________________    This note was partially transcribed via voice recognition software. Although efforts have been made to catch any discrepancies, it may contain sound alike words, grammatical errors, or nonsensical words.          Attestations:  This note is prepared by [employer] , acting as Neurosurgeon for [employer]. Edilia Bo, MD .    Duane Lope. Edilia Bo, MD :  The scribe's documentation has been  prepared under my direction and personally reviewed by me in its entirety.  I confirm that the note above accurately reflects all work, treatment, procedures, and medical decision making performed by me.

## 2018-08-20 NOTE — ED Notes (Signed)
Patient arrives ambulatory to ED with c/c of head pain and neck pain, onset a few weeks. Patient is requesting an MRI saying everyone is taking this lightly. Patient was seen at [facility] these last 2 days and had blood drawn and was tested for strep throat which was all negative.

## 2018-08-21 ENCOUNTER — Inpatient Hospital Stay
Admit: 2018-08-21 | Discharge: 2018-08-21 | Disposition: A | Discharge status: Home / Self Care | Payer: MEDICAID | Attending: Emergency Medicine

## 2018-08-21 ENCOUNTER — Inpatient Hospital Stay: Admit: 2018-08-21 | Payer: MEDICAID | Attending: Emergency Medicine

## 2018-08-21 MED ORDER — PSEUDOEPHEDRINE-GUAIFENESIN SR 60 MG-600 MG 12 HR TAB
60-600 mg | ORAL_TABLET | Freq: Two times a day (BID) | ORAL | 1 refills | Status: AC | PRN
Start: 2018-08-21 — End: ?

## 2018-08-21 MED ORDER — ZINC LOZENGES 25 MG
25 mg | LOZENGE | ORAL | 0 refills | Status: AC
Start: 2018-08-21 — End: ?

## 2018-08-21 MED ORDER — OMEPRAZOLE 20 MG TAB, DELAYED RELEASE
20 mg | ORAL_TABLET | Freq: Every day | ORAL | 1 refills | Status: AC
Start: 2018-08-21 — End: ?

## 2018-08-21 NOTE — ED Notes (Signed)
Pt discharged home. Reviewed d/c teaching with pt including reasons to return, f/u care, and home care. Pt verbalizes understanding. Paper copies of d/c teaching given.  Scripts provided and instructed. Pt verbalizes understanding.   Pt is alert, and in NAD. Ambulatory with steady gait.   Patient armband removed and shredded

## 2018-08-21 NOTE — ED Notes (Signed)
Pt discharged home. Reviewed d/c teaching with pt including reasons to return, f/u care, and home care. Pt verbalizes understanding. Paper copies of d/c teaching given.  Scripts provided and instructed. Pt verbalizes understanding.   Pt is alert, and in NAD. Ambulatory with steady gait.   Patient armband removed and shredded

## 2019-11-24 IMAGING — CR DG CHEST 2V
2 series · 2 of 2 positions shown · non-contrast
Comparison: None.

CLINICAL DATA: Pain under both breasts

EXAM:
CHEST - 2 VIEW

[chest pa]
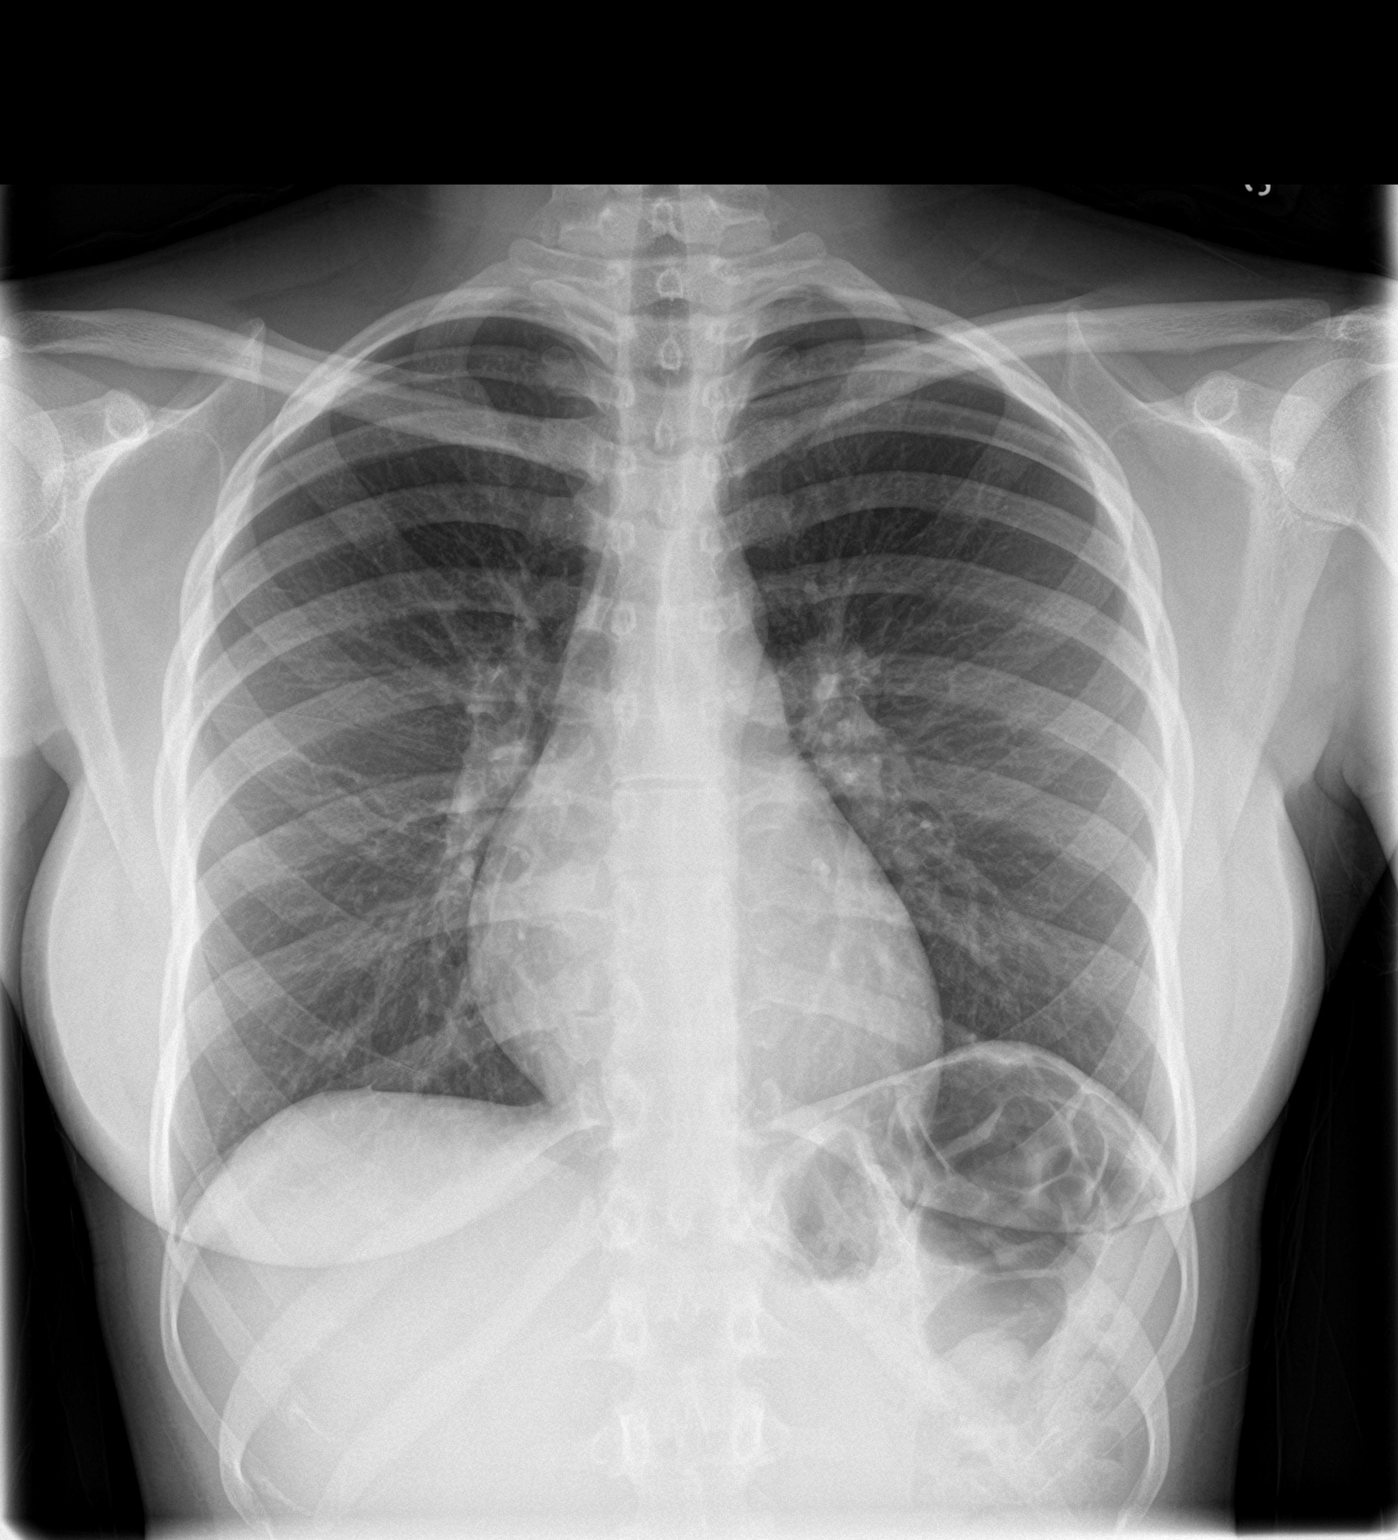

[chest lat]
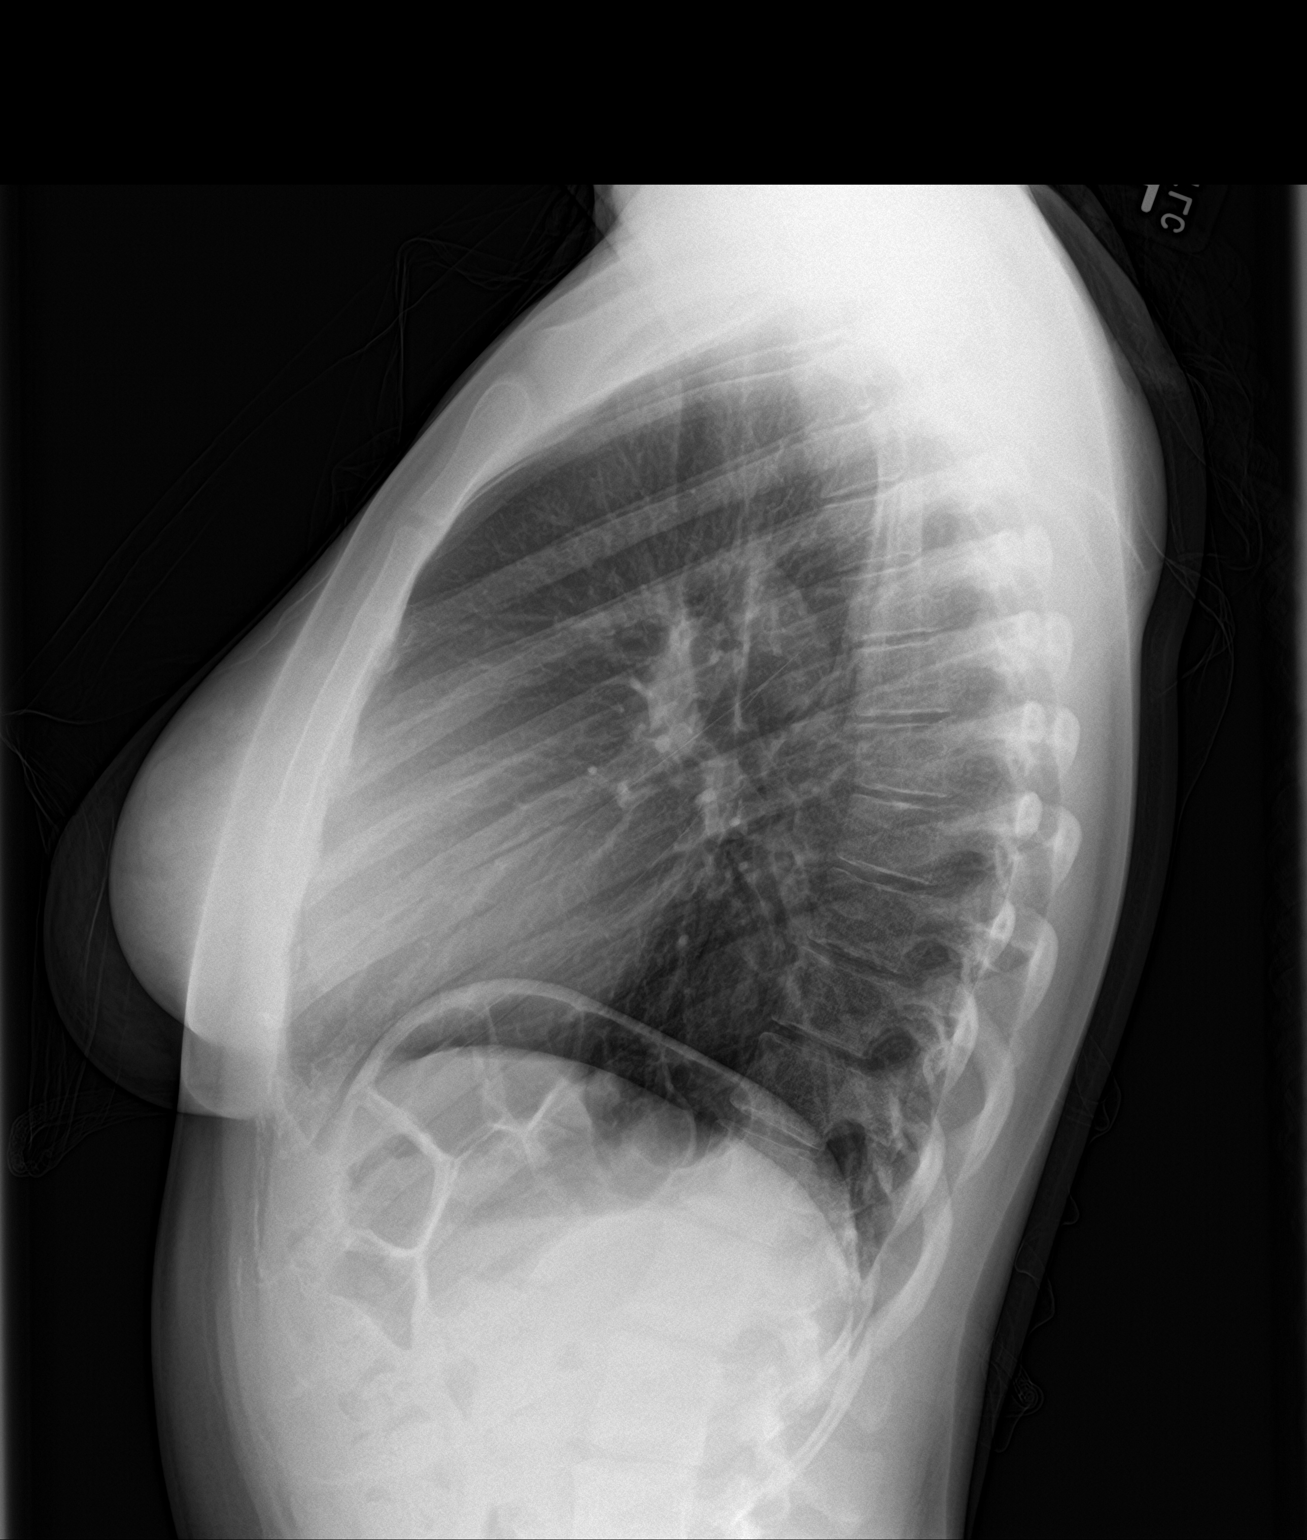

[2 of 2 positions shown; findings below may reference images not displayed]

FINDINGS: The heart size and mediastinal contours are within normal limits.
Both lungs are clear. The visualized skeletal structures are
unremarkable.
IMPRESSION: No active cardiopulmonary disease.
